# Patient Record
Sex: Male | Born: 1950 | Race: White | Hispanic: No | Marital: Married | Smoking: Never smoker
Health system: Southern US, Community
[De-identification: ages and names within clinical notes are randomized; demographics above are authoritative.]

## PROBLEM LIST (undated history)

## (undated) DIAGNOSIS — E669 Obesity, unspecified: Secondary | ICD-10-CM

## (undated) DIAGNOSIS — E7800 Pure hypercholesterolemia, unspecified: Secondary | ICD-10-CM

## (undated) DIAGNOSIS — I10 Essential (primary) hypertension: Secondary | ICD-10-CM

## (undated) DIAGNOSIS — K219 Gastro-esophageal reflux disease without esophagitis: Secondary | ICD-10-CM

## (undated) DIAGNOSIS — M5430 Sciatica, unspecified side: Secondary | ICD-10-CM

## (undated) DIAGNOSIS — M109 Gout, unspecified: Secondary | ICD-10-CM

## (undated) DIAGNOSIS — M542 Cervicalgia: Secondary | ICD-10-CM

## (undated) HISTORY — DX: Gout, unspecified: M10.9

## (undated) HISTORY — DX: Essential (primary) hypertension: I10

## (undated) HISTORY — PX: NECK SURGERY: SHX720

## (undated) HISTORY — DX: Obesity, unspecified: E66.9

## (undated) HISTORY — DX: Cervicalgia: M54.2

## (undated) HISTORY — DX: Gastro-esophageal reflux disease without esophagitis: K21.9

## (undated) HISTORY — DX: Sciatica, unspecified side: M54.30

## (undated) HISTORY — DX: Pure hypercholesterolemia, unspecified: E78.00

---

## 2010-06-22 ENCOUNTER — Ambulatory Visit (HOSPITAL_COMMUNITY)
Admission: RE | Admit: 2010-06-22 | Discharge: 2010-06-23 | Payer: Self-pay | Source: Home / Self Care | Attending: Neurosurgery | Admitting: Neurosurgery

## 2010-09-11 LAB — CBC
HCT: 42.7 % (ref 39.0–52.0)
Hemoglobin: 15 g/dL (ref 13.0–17.0)
MCH: 32.5 pg (ref 26.0–34.0)
MCHC: 35.1 g/dL (ref 30.0–36.0)
MCV: 92.4 fL (ref 78.0–100.0)
Platelets: 218 10*3/uL (ref 150–400)
RBC: 4.62 MIL/uL (ref 4.22–5.81)
RDW: 13.2 % (ref 11.5–15.5)

## 2010-09-11 LAB — BASIC METABOLIC PANEL
BUN: 13 mg/dL (ref 6–23)
GFR calc Af Amer: >60 mL/min (ref >60)
Potassium: 4.4 mEq/L (ref 3.5–5.1)

## 2010-09-11 LAB — SURGICAL PCR SCREEN: MRSA, PCR: NEGATIVE

## 2015-11-18 DIAGNOSIS — Z1389 Encounter for screening for other disorder: Secondary | ICD-10-CM | POA: Diagnosis not present

## 2015-11-18 DIAGNOSIS — R5381 Other malaise: Secondary | ICD-10-CM | POA: Diagnosis not present

## 2015-11-18 DIAGNOSIS — R51 Headache: Secondary | ICD-10-CM | POA: Diagnosis not present

## 2015-11-18 DIAGNOSIS — R5383 Other fatigue: Secondary | ICD-10-CM | POA: Diagnosis not present

## 2016-02-23 DIAGNOSIS — M5416 Radiculopathy, lumbar region: Secondary | ICD-10-CM | POA: Diagnosis not present

## 2016-02-23 DIAGNOSIS — M545 Low back pain: Secondary | ICD-10-CM | POA: Diagnosis not present

## 2016-02-23 DIAGNOSIS — M9903 Segmental and somatic dysfunction of lumbar region: Secondary | ICD-10-CM | POA: Diagnosis not present

## 2016-02-23 DIAGNOSIS — M6281 Muscle weakness (generalized): Secondary | ICD-10-CM | POA: Diagnosis not present

## 2016-02-23 DIAGNOSIS — M79605 Pain in left leg: Secondary | ICD-10-CM | POA: Diagnosis not present

## 2016-02-23 DIAGNOSIS — M9904 Segmental and somatic dysfunction of sacral region: Secondary | ICD-10-CM | POA: Diagnosis not present

## 2016-02-23 DIAGNOSIS — M5116 Intervertebral disc disorders with radiculopathy, lumbar region: Secondary | ICD-10-CM | POA: Diagnosis not present

## 2016-02-23 DIAGNOSIS — M9902 Segmental and somatic dysfunction of thoracic region: Secondary | ICD-10-CM | POA: Diagnosis not present

## 2016-02-23 DIAGNOSIS — M5430 Sciatica, unspecified side: Secondary | ICD-10-CM | POA: Diagnosis not present

## 2016-02-24 DIAGNOSIS — M9902 Segmental and somatic dysfunction of thoracic region: Secondary | ICD-10-CM | POA: Diagnosis not present

## 2016-02-24 DIAGNOSIS — M5116 Intervertebral disc disorders with radiculopathy, lumbar region: Secondary | ICD-10-CM | POA: Diagnosis not present

## 2016-02-24 DIAGNOSIS — M9903 Segmental and somatic dysfunction of lumbar region: Secondary | ICD-10-CM | POA: Diagnosis not present

## 2016-02-24 DIAGNOSIS — M9904 Segmental and somatic dysfunction of sacral region: Secondary | ICD-10-CM | POA: Diagnosis not present

## 2016-02-27 DIAGNOSIS — M6281 Muscle weakness (generalized): Secondary | ICD-10-CM | POA: Diagnosis not present

## 2016-02-27 DIAGNOSIS — M5416 Radiculopathy, lumbar region: Secondary | ICD-10-CM | POA: Diagnosis not present

## 2016-02-27 DIAGNOSIS — M79605 Pain in left leg: Secondary | ICD-10-CM | POA: Diagnosis not present

## 2016-02-27 DIAGNOSIS — M9903 Segmental and somatic dysfunction of lumbar region: Secondary | ICD-10-CM | POA: Diagnosis not present

## 2016-02-27 DIAGNOSIS — M545 Low back pain: Secondary | ICD-10-CM | POA: Diagnosis not present

## 2016-02-27 DIAGNOSIS — M5116 Intervertebral disc disorders with radiculopathy, lumbar region: Secondary | ICD-10-CM | POA: Diagnosis not present

## 2016-02-27 DIAGNOSIS — M9904 Segmental and somatic dysfunction of sacral region: Secondary | ICD-10-CM | POA: Diagnosis not present

## 2016-02-27 DIAGNOSIS — M9902 Segmental and somatic dysfunction of thoracic region: Secondary | ICD-10-CM | POA: Diagnosis not present

## 2016-02-29 DIAGNOSIS — M9902 Segmental and somatic dysfunction of thoracic region: Secondary | ICD-10-CM | POA: Diagnosis not present

## 2016-02-29 DIAGNOSIS — M9903 Segmental and somatic dysfunction of lumbar region: Secondary | ICD-10-CM | POA: Diagnosis not present

## 2016-02-29 DIAGNOSIS — M5116 Intervertebral disc disorders with radiculopathy, lumbar region: Secondary | ICD-10-CM | POA: Diagnosis not present

## 2016-02-29 DIAGNOSIS — M5416 Radiculopathy, lumbar region: Secondary | ICD-10-CM | POA: Diagnosis not present

## 2016-02-29 DIAGNOSIS — M79605 Pain in left leg: Secondary | ICD-10-CM | POA: Diagnosis not present

## 2016-02-29 DIAGNOSIS — M6281 Muscle weakness (generalized): Secondary | ICD-10-CM | POA: Diagnosis not present

## 2016-02-29 DIAGNOSIS — M9904 Segmental and somatic dysfunction of sacral region: Secondary | ICD-10-CM | POA: Diagnosis not present

## 2016-02-29 DIAGNOSIS — M545 Low back pain: Secondary | ICD-10-CM | POA: Diagnosis not present

## 2016-03-01 DIAGNOSIS — M9904 Segmental and somatic dysfunction of sacral region: Secondary | ICD-10-CM | POA: Diagnosis not present

## 2016-03-01 DIAGNOSIS — M5116 Intervertebral disc disorders with radiculopathy, lumbar region: Secondary | ICD-10-CM | POA: Diagnosis not present

## 2016-03-01 DIAGNOSIS — M9903 Segmental and somatic dysfunction of lumbar region: Secondary | ICD-10-CM | POA: Diagnosis not present

## 2016-03-01 DIAGNOSIS — M9902 Segmental and somatic dysfunction of thoracic region: Secondary | ICD-10-CM | POA: Diagnosis not present

## 2016-03-06 DIAGNOSIS — M545 Low back pain: Secondary | ICD-10-CM | POA: Diagnosis not present

## 2016-03-06 DIAGNOSIS — M9902 Segmental and somatic dysfunction of thoracic region: Secondary | ICD-10-CM | POA: Diagnosis not present

## 2016-03-06 DIAGNOSIS — M9903 Segmental and somatic dysfunction of lumbar region: Secondary | ICD-10-CM | POA: Diagnosis not present

## 2016-03-06 DIAGNOSIS — M9904 Segmental and somatic dysfunction of sacral region: Secondary | ICD-10-CM | POA: Diagnosis not present

## 2016-03-06 DIAGNOSIS — M6281 Muscle weakness (generalized): Secondary | ICD-10-CM | POA: Diagnosis not present

## 2016-03-06 DIAGNOSIS — M5116 Intervertebral disc disorders with radiculopathy, lumbar region: Secondary | ICD-10-CM | POA: Diagnosis not present

## 2016-03-06 DIAGNOSIS — M5416 Radiculopathy, lumbar region: Secondary | ICD-10-CM | POA: Diagnosis not present

## 2016-03-06 DIAGNOSIS — M79605 Pain in left leg: Secondary | ICD-10-CM | POA: Diagnosis not present

## 2016-03-07 DIAGNOSIS — M549 Dorsalgia, unspecified: Secondary | ICD-10-CM | POA: Diagnosis not present

## 2016-03-07 DIAGNOSIS — M4316 Spondylolisthesis, lumbar region: Secondary | ICD-10-CM | POA: Diagnosis not present

## 2016-03-07 DIAGNOSIS — M5136 Other intervertebral disc degeneration, lumbar region: Secondary | ICD-10-CM | POA: Diagnosis not present

## 2016-03-07 DIAGNOSIS — M5416 Radiculopathy, lumbar region: Secondary | ICD-10-CM | POA: Diagnosis not present

## 2016-03-15 DIAGNOSIS — N400 Enlarged prostate without lower urinary tract symptoms: Secondary | ICD-10-CM | POA: Diagnosis not present

## 2016-03-20 ENCOUNTER — Other Ambulatory Visit: Payer: Self-pay | Admitting: Orthopaedic Surgery

## 2016-03-20 DIAGNOSIS — M4310 Spondylolisthesis, site unspecified: Secondary | ICD-10-CM

## 2016-03-28 DIAGNOSIS — N401 Enlarged prostate with lower urinary tract symptoms: Secondary | ICD-10-CM | POA: Diagnosis not present

## 2016-03-28 DIAGNOSIS — N138 Other obstructive and reflux uropathy: Secondary | ICD-10-CM | POA: Diagnosis not present

## 2016-03-28 DIAGNOSIS — Z6829 Body mass index (BMI) 29.0-29.9, adult: Secondary | ICD-10-CM | POA: Diagnosis not present

## 2016-03-30 ENCOUNTER — Ambulatory Visit
Admission: RE | Admit: 2016-03-30 | Discharge: 2016-03-30 | Disposition: A | Discharge status: Home / Self Care | Payer: BLUE CROSS/BLUE SHIELD | Source: Ambulatory Visit | Attending: Orthopaedic Surgery | Admitting: Orthopaedic Surgery

## 2016-03-30 DIAGNOSIS — M5136 Other intervertebral disc degeneration, lumbar region: Secondary | ICD-10-CM | POA: Diagnosis not present

## 2016-03-30 DIAGNOSIS — M5126 Other intervertebral disc displacement, lumbar region: Secondary | ICD-10-CM | POA: Diagnosis not present

## 2016-03-30 DIAGNOSIS — M4316 Spondylolisthesis, lumbar region: Secondary | ICD-10-CM | POA: Diagnosis not present

## 2016-03-30 DIAGNOSIS — M4310 Spondylolisthesis, site unspecified: Secondary | ICD-10-CM

## 2016-03-30 DIAGNOSIS — M5416 Radiculopathy, lumbar region: Secondary | ICD-10-CM | POA: Diagnosis not present

## 2016-04-13 DIAGNOSIS — M5136 Other intervertebral disc degeneration, lumbar region: Secondary | ICD-10-CM | POA: Diagnosis not present

## 2016-04-13 DIAGNOSIS — M5416 Radiculopathy, lumbar region: Secondary | ICD-10-CM | POA: Diagnosis not present

## 2016-04-23 DIAGNOSIS — M5416 Radiculopathy, lumbar region: Secondary | ICD-10-CM | POA: Diagnosis not present

## 2016-05-07 DIAGNOSIS — M4316 Spondylolisthesis, lumbar region: Secondary | ICD-10-CM | POA: Diagnosis not present

## 2016-05-07 DIAGNOSIS — M5136 Other intervertebral disc degeneration, lumbar region: Secondary | ICD-10-CM | POA: Diagnosis not present

## 2016-05-07 DIAGNOSIS — M5416 Radiculopathy, lumbar region: Secondary | ICD-10-CM | POA: Diagnosis not present

## 2016-05-21 DIAGNOSIS — M5416 Radiculopathy, lumbar region: Secondary | ICD-10-CM | POA: Diagnosis not present

## 2016-07-09 DIAGNOSIS — M5416 Radiculopathy, lumbar region: Secondary | ICD-10-CM | POA: Diagnosis not present

## 2016-09-06 DIAGNOSIS — Z0000 Encounter for general adult medical examination without abnormal findings: Secondary | ICD-10-CM | POA: Diagnosis not present

## 2016-09-24 DIAGNOSIS — M5416 Radiculopathy, lumbar region: Secondary | ICD-10-CM | POA: Diagnosis not present

## 2016-09-24 DIAGNOSIS — M5136 Other intervertebral disc degeneration, lumbar region: Secondary | ICD-10-CM | POA: Diagnosis not present

## 2016-11-20 DIAGNOSIS — M109 Gout, unspecified: Secondary | ICD-10-CM | POA: Diagnosis not present

## 2016-11-20 DIAGNOSIS — E7800 Pure hypercholesterolemia, unspecified: Secondary | ICD-10-CM | POA: Diagnosis not present

## 2016-11-20 DIAGNOSIS — Z125 Encounter for screening for malignant neoplasm of prostate: Secondary | ICD-10-CM | POA: Diagnosis not present

## 2016-11-20 DIAGNOSIS — I10 Essential (primary) hypertension: Secondary | ICD-10-CM | POA: Diagnosis not present

## 2016-11-20 DIAGNOSIS — Z1389 Encounter for screening for other disorder: Secondary | ICD-10-CM | POA: Diagnosis not present

## 2016-11-20 DIAGNOSIS — R5383 Other fatigue: Secondary | ICD-10-CM | POA: Diagnosis not present

## 2017-04-09 DIAGNOSIS — Z0000 Encounter for general adult medical examination without abnormal findings: Secondary | ICD-10-CM | POA: Diagnosis not present

## 2017-04-09 DIAGNOSIS — Z6831 Body mass index (BMI) 31.0-31.9, adult: Secondary | ICD-10-CM | POA: Diagnosis not present

## 2017-04-10 ENCOUNTER — Encounter: Payer: Self-pay | Admitting: Neurology

## 2017-05-21 DIAGNOSIS — E049 Nontoxic goiter, unspecified: Secondary | ICD-10-CM | POA: Diagnosis not present

## 2017-07-15 ENCOUNTER — Ambulatory Visit: Payer: Self-pay | Admitting: Neurology

## 2017-08-02 ENCOUNTER — Encounter: Payer: Self-pay | Admitting: Neurology

## 2017-08-02 ENCOUNTER — Other Ambulatory Visit (INDEPENDENT_AMBULATORY_CARE_PROVIDER_SITE_OTHER): Payer: BLUE CROSS/BLUE SHIELD

## 2017-08-02 ENCOUNTER — Ambulatory Visit: Payer: BLUE CROSS/BLUE SHIELD | Admitting: Neurology

## 2017-08-02 VITALS — BP 120/84 | HR 50 | Ht 69.0 in | Wt 212.2 lb

## 2017-08-02 DIAGNOSIS — G5712 Meralgia paresthetica, left lower limb: Secondary | ICD-10-CM | POA: Insufficient documentation

## 2017-08-02 LAB — VITAMIN B12: Vitamin B-12: 1118 pg/mL — ABNORMAL HIGH (ref 211–911)

## 2017-08-02 LAB — TSH: TSH: 2.49 u[IU]/mL (ref 0.35–4.50)

## 2017-08-02 LAB — SEDIMENTATION RATE: Sed Rate: 8 mm/hr (ref 0–20)

## 2017-08-02 LAB — C-REACTIVE PROTEIN: CRP: 0.2 mg/dL — ABNORMAL LOW (ref 0.5–20.0)

## 2017-08-02 MED ORDER — GABAPENTIN 300 MG PO CAPS: ORAL_CAPSULE | ORAL | 5 refills | Status: AC

## 2017-08-02 NOTE — Progress Notes (Signed)
[facility] Clinic Note - Initial Visit   Date: 08/02/17  Randall RighterDonald E Denison MRN: [id number] DOB: January 05, 1951   Dear Dr. Jeanie Seweredding:  Thank you for your kind referral of Randall RighterDonald E Soohoo for consultation of left thigh numbness. Although his history is well known to you, please allow us to reiterate it for the purpose of our medical record. The patient was accompanied to the clinic by self.    History of Present Illness: Randall RighterDonald E Spychalski is a 67 y.o. right-handed Caucasian male with hyperlipidemia, GERD, gout, hypertension, and s/p cervical ACDF presenting for evaluation of left thigh pain.    Starting in September 2017, he was on a business trip and developed severe pain in which started in the low back and radiated down into his lateral thigh.  He was started on prednisone and physical therapy by his PCP.  He was referred to Spine specialist who administered injection at L5 which did not help.  He had another injection at L4 which alleviated his pain, but did not improve his numbness.  MRI lumbar spine showed mild impingement of the left L2 nerve root, but nothing affecting the L4 nerve.    He no longer has low back pain or weakness of the legs, such as climbing stairs.  Currently, he continues to have constant numbness over the anteriolateral thigh and gets tingling sensation with light touch or tactile stimuli, such as his keys in his pocket.  He has tried gabapentin and Lyrica for severe back pain, but has not taken this for his ongoing paresthesias.  He denies any weight gain or tight constrictive clothing.    Out-side paper records, electronic medical record, and images have been reviewed where available and summarized as:  MRI lumbar spine 03/30/2016:  1. At L2-3 there is a left lateral disc protrusion with mild mass effect on the left extraforaminal L2 nerve root. Labs 08/2016:  HbA1c 5.4  Past Medical History:  Diagnosis Date  . GERD (gastroesophageal reflux  disease)   . Gout   . Hypercholesteremia   . Hypertension   . Obesity     Past Surgical History:  Procedure Laterality Date  . NECK SURGERY     Medications:  Outpatient Encounter Medications as of 08/02/2017  Medication Sig  . allopurinol (ZYLOPRIM) 300 MG tablet   . amLODipine (NORVASC) 5 MG tablet   . atorvastatin (LIPITOR) 20 MG tablet   . Cholecalciferol (VITAMIN D3) 2000 units capsule Take by mouth.  Marland Kitchen. Co-Enzyme Q-10 30 MG CAPS Take by mouth.  . Cyanocobalamin (B-12 TR) 2000 MCG TBCR Take by mouth.  . lansoprazole (PREVACID) 15 MG capsule   . Multiple Vitamin (MULTIVITAMIN) capsule Take by mouth.  . olmesartan (BENICAR) 20 MG tablet Take by mouth.  . Probiotic Product ([employer] PROBIOTIC MAXIMUM STRENGTH) CAPS Take by mouth.   No facility-administered encounter medications on file as of 08/02/2017.      Allergies: No Known Allergies    Family History: Father had diabetes, diet-controlled and diet at the age of 65. Mother died of natural causes at 3192. Sister passed away from ovarian cancer. No family history of neurological conditions.  Social History: Social History   Tobacco Use  . Smoking status: Never Smoker  . Smokeless tobacco: Never Used  Substance Use Topics  . Alcohol use: Yes  . Drug use: No   Social History   Social History Narrative   Lives with wife in a 2 story home.  Has 2 children.  Works  in management.  Education: Scientist, water quality.    Review of Systems:  CONSTITUTIONAL: No fevers, chills, night sweats, or weight loss.   EYES: No visual changes or eye pain ENT: No hearing changes.  No history of nose bleeds.   RESPIRATORY: No cough, wheezing and shortness of breath.   CARDIOVASCULAR: Negative for chest pain, and palpitations.   GI: Negative for abdominal discomfort, blood in stools or black stools.  No recent change in bowel habits.   GU:  No history of incontinence.   MUSCLOSKELETAL: No history of joint pain or swelling.  No myalgias.   SKIN: Negative  for lesions, rash, and itching.   HEMATOLOGY/ONCOLOGY: Negative for prolonged bleeding, bruising easily, and swollen nodes.  No history of cancer.   ENDOCRINE: Negative for cold or heat intolerance, polydipsia or goiter.   PSYCH:  No depression or anxiety symptoms.   NEURO: As Above.   Vital Signs:  BP 120/84   Pulse (!) 50   Ht 5\' 9"  (1.753 m)   Wt 212 lb 4 oz (96.3 kg)   SpO2 98%   BMI 31.34 kg/m    General Medical Exam:   General:  Well appearing, comfortable.   Eyes/ENT: see cranial nerve examination.   Neck: No masses appreciated.  Full range of motion without tenderness.  No carotid bruits. Respiratory:  Clear to auscultation, good air entry bilaterally.   Cardiac:  Regular rate and rhythm, no murmur.   Extremities:  No deformities, edema, or skin discoloration.  Skin:  No rashes or lesions.  Neurological Exam: MENTAL STATUS including orientation to time, place, person, recent and remote memory, attention span and concentration, language, and fund of knowledge is normal.  Speech is not dysarthric.  CRANIAL NERVES: II:  No visual field defects.  Unremarkable fundi.   III-IV-VI: Pupils equal round and reactive to light.  Normal conjugate, extra-ocular eye movements in all directions of gaze.  No nystagmus.  No ptosis.   V:  Normal facial sensation.    VII:  Normal facial symmetry and movements.  No pathologic facial reflexes.  VIII:  Normal hearing and vestibular function.   IX-X:  Normal palatal movement.   XI:  Normal shoulder shrug and head rotation.   XII:  Normal tongue strength and range of motion, no deviation or fasciculation.  MOTOR:  No atrophy, fasciculations or abnormal movements.  No pronator drift.  Tone is normal.   There are no palpable masses over the LFCN region on the left.  Right Upper Extremity:    Left Upper Extremity:    Deltoid  5/5   Deltoid  5/5   Biceps  5/5   Biceps  5/5   Triceps  5/5   Triceps  5/5   Wrist extensors  5/5   Wrist extensors   5/5   Wrist flexors  5/5   Wrist flexors  5/5   Finger extensors  5/5   Finger extensors  5/5   Finger flexors  5/5   Finger flexors  5/5   Dorsal interossei  5/5   Dorsal interossei  5/5   Abductor pollicis  5/5   Abductor pollicis  5/5   Tone (Ashworth scale)  0  Tone (Ashworth scale)  0   Right Lower Extremity:    Left Lower Extremity:    Hip flexors  5/5   Hip flexors  5/5   Hip extensors  5/5   Hip extensors  5/5   Knee flexors  5/5   Knee flexors  5/5   Knee extensors  5/5   Knee extensors  5/5   Dorsiflexors  5/5   Dorsiflexors  5/5   Plantarflexors  5/5   Plantarflexors  5/5   Toe extensors  5/5   Toe extensors  5/5   Toe flexors  5/5   Toe flexors  5/5   Tone (Ashworth scale)  0  Tone (Ashworth scale)  0   MSRs:  Right                                                                 Left brachioradialis 2+  brachioradialis 2+  biceps 2+  biceps 2+  triceps 2+  triceps 2+  patellar 2+  patellar 2+  ankle jerk 2+  ankle jerk 2+  Hoffman no  Hoffman no  plantar response down  plantar response down   SENSORY:  Reduced temperature and light touch over the left anterolateral thigh, hyperesthesia with pin prick over the same region.  Otherwise, normal and symmetric perception of light touch, pinprick, vibration, and proprioception.  Romberg's sign absent.   COORDINATION/GAIT: Normal finger-to- nose-finger and heel-to-shin.  Intact rapid alternating movements bilaterally.  Able to rise from a chair without using arms.  Gait narrow based and stable. Tandem and stressed gait intact.    IMPRESSION: Mr. Karl is a 67 year-old man referred for evaluation of left anterolateral thigh numbness and pain.  His examination is consistent with diminished sensation in all modalities over the anterolateral thigh on the left side. 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 Management is  conservative with symptom management and avoidance of repetitive trauma to this region.  I have discussed the diagnosis, pathophysiology, management plan, and risks and benefits of medications, and prognosis.  I do not see that his lumbar injections have contributed to his paresthesias, as they do not confirm to either L4 or L5 dermatome.     PLAN/RECOMMENDATIONS:  1. Start neurontin 300mg  at bedtimex 1 week, then increase to 1 tablet twice daily 2. Start OTC lidocaine ointment to left thigh 3. Avoid tight fitting clothing or belts  4. Weight loss encouraged 5. NCS/EMG of the left > right leg - it was explained that LFCN is technically challenging to study and often absent in normal individuals, but the study would exclude lumbosacral radiculopathy  Return to clinic in 70-months  Thank you for allowing me to participate in patient's care.  If I can answer any additional questions, I would be pleased to do so.    Sincerely,    Donika K. Allena Katz, DO

## 2017-08-02 NOTE — Patient Instructions (Signed)
1.  Start gabapentin 300mg  at bedtime x 1 week, then increase to 1 tablet twice daily 2.  You can also try over the counter lidocaine ointments (Aspercream, Salonpas, etc).  3.  NCS/EMG of the left > right leg 4.  Check labs   Return to clinic in 4 months

## 2017-08-05 ENCOUNTER — Telehealth: Payer: Self-pay | Admitting: *Deleted

## 2017-08-05 NOTE — Telephone Encounter (Signed)
Left message giving patient results.  

## 2017-08-05 NOTE — Telephone Encounter (Signed)
-----   Message from [place] Chardonika K Patel, DO sent at 08/02/2017  4:13 PM EST ----- Please notify patient lab are within normal limits.  Thank you.

## 2017-08-08 ENCOUNTER — Ambulatory Visit (INDEPENDENT_AMBULATORY_CARE_PROVIDER_SITE_OTHER): Payer: BLUE CROSS/BLUE SHIELD | Admitting: Neurology

## 2017-08-08 DIAGNOSIS — G5712 Meralgia paresthetica, left lower limb: Secondary | ICD-10-CM

## 2017-08-08 NOTE — Progress Notes (Signed)
Follow-up Visit   Date: 08/08/17    Randell E Keach MRN: [id number] DOB: 12/10/1950   Interim History: Valentin E Copeman is a 67 y.o. right-handed Caucasian male with hyperlipidemia, GERD, gout, hypertension, and s/p cervical ACDF returning to the clinic for electrodiagnostic testing for left thigh numbness.  The patient was accompanied to the clinic by self.  Electrodiagnostic testing shows no evidence of a lumbosacral radiculopathy or sensorimotor polyneuropathy affecting the left lower extremity. Bilateral femoral cutaneous sensory responses are absent, and we discussed this to be a technical finding especially given his unaffected side is also absent.  Labs thus far have been normal (ESR, vitamin B 12, TSH, and CRP).  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 There is no evidence of a L3-L4 radiculopathy on imaging or by electrodiagnostic testing. Patient is currently on gabapentin 300 mg at bedtime and has not noticed marked improvement in his numbness. I encouraged him to continue this for another month and if he does not appreciate any improvement in his pain, he may stop this as medications do not help with sensation of numbness. If his symptoms get worse when he develops acute pelvic pain, we can consider imaging of the upper thigh to look for structural lesion as he does not have typical risk factors for meralgia paresthetica. All questions were answered.  Return to clinic in 6 months.  Greater than 80% of this 15 minute visit was spent in counseling, explanation of diagnosis, planning of further management, and coordination of care (excluding procedure time).  Thank you for allowing me to participate in patient's care.  If I can answer any additional questions, I would be pleased to do so.    Sincerely,    Donika K. Patel, DO   

## 2017-08-08 NOTE — Procedures (Signed)
[facility]  [address], [address]  [place], [place] [address] Tel: [phone] Fax:  [phone] Test Date:  08/08/2017  Patient: Randall Williamson DOB: 1950/10/13 Physician: Nita Sickleonika Johaan Ryser, DO  Sex: Male Height: 5\' 9"  Ref Phys: Nita Sickleonika Quanda Pavlicek, DO  ID#: [id number] Temp: 34.2C Technician:    Patient Complaints: This is a 67 year-old man referred for evaluation of left lateral thigh numbness.  NCV & EMG Findings: Extensive electrodiagnostic testing of the left lower extremity and additional studies of the right shows:  1. Left sural and superficial peroneal sensory responses are within normal limits. Bilateral lateral femoral cutaneous sensory responses are absent; these findings are most likely technical in nature. 2. Left tibial and peroneal motor responses are within normal limits. 3. Left tibial H reflex study is within normal limits. 4. There is no evidence of active or chronic motor axon loss changes affecting any of the tested muscles. Motor unit configuration and recruitment pattern is within normal limits.  Impression: This is a normal study of the left lower extremity. In particular, there is no evidence of a lumbosacral radiculopathy or sensorimotor polyneuropathy.  Absent bilateral femoral cutaneous sensory responses are most likely technical in nature; correlate clinically.   ___________________________ Nita Sickleonika Nishan Ovens, DO    Nerve Conduction Studies Anti Sensory Summary Table   Stim Site NR Peak (ms) Norm Peak (ms) P-T Amp (V) Norm P-T Amp  Left Lat Femoral Cutan Anti Sensory (Lateral Thigh)  ASIS NR  <3.1  >10  Right Lat Femoral Cutan Anti Sensory (Lateral Thigh)  ASIS NR  <3.1  >10  Left Sup Peroneal Anti Sensory (Ant Lat Mall)  12 cm    3.8 <4.6 9.0 >3  Left Sural Anti Sensory (Lat Mall)  Calf    3.5 <4.6 9.9 >3   Motor Summary Table   Stim Site NR Onset (ms) Norm Onset (ms) O-P Amp (mV) Norm O-P Amp Site1 Site2 Delta-0 (ms) Dist (cm) Vel (m/s)  Norm Vel (m/s)  Left Peroneal Motor (Ext Dig Brev)  Ankle    3.6 <6.0 7.9 >2.5 B Fib Ankle 7.9 35.0 44 >40  B Fib    11.5  7.0  Poplt B Fib 1.7 9.0 53 >40  Poplt    13.2  6.7         Left Tibial Motor (Abd Hall Brev)  Ankle    5.5 <6.0 8.8 >4 Knee Ankle 9.0 37.0 41 >40  Knee    14.5  7.0          H Reflex Studies   NR H-Lat (ms) Lat Norm (ms) L-R H-Lat (ms)  Left Tibial (Gastroc)     33.20 <35    EMG   Side Muscle Ins Act Fibs Psw Fasc Number Recrt Dur Dur. Amp Amp. Poly Poly. Comment  Left AntTibialis Nml Nml Nml Nml Nml Nml Nml Nml Nml Nml Nml Nml N/A  Left Gastroc Nml Nml Nml Nml Nml Nml Nml Nml Nml Nml Nml Nml N/A  Left RectFemoris Nml Nml Nml Nml Nml Nml Nml Nml Nml Nml Nml Nml N/A  Left GluteusMed Nml Nml Nml Nml Nml Nml Nml Nml Nml Nml Nml Nml N/A  Left Iliacus Nml Nml Nml Nml Nml Nml Nml Nml Nml Nml Nml Nml N/A      Waveforms:

## 2017-09-09 ENCOUNTER — Ambulatory Visit: Payer: BLUE CROSS/BLUE SHIELD | Admitting: Neurology

## 2017-11-20 DIAGNOSIS — E782 Mixed hyperlipidemia: Secondary | ICD-10-CM | POA: Diagnosis not present

## 2017-11-20 DIAGNOSIS — Z125 Encounter for screening for malignant neoplasm of prostate: Secondary | ICD-10-CM | POA: Diagnosis not present

## 2017-11-20 DIAGNOSIS — R5383 Other fatigue: Secondary | ICD-10-CM | POA: Diagnosis not present

## 2017-11-20 DIAGNOSIS — I10 Essential (primary) hypertension: Secondary | ICD-10-CM | POA: Diagnosis not present

## 2017-11-20 DIAGNOSIS — Z79899 Other long term (current) drug therapy: Secondary | ICD-10-CM | POA: Diagnosis not present

## 2017-12-13 ENCOUNTER — Ambulatory Visit: Payer: BLUE CROSS/BLUE SHIELD | Admitting: Neurology

## 2018-05-28 DIAGNOSIS — Z23 Encounter for immunization: Secondary | ICD-10-CM | POA: Diagnosis not present

## 2018-05-28 DIAGNOSIS — I10 Essential (primary) hypertension: Secondary | ICD-10-CM | POA: Diagnosis not present

## 2018-05-28 DIAGNOSIS — Z6831 Body mass index (BMI) 31.0-31.9, adult: Secondary | ICD-10-CM | POA: Diagnosis not present

## 2018-05-28 DIAGNOSIS — Z1331 Encounter for screening for depression: Secondary | ICD-10-CM | POA: Diagnosis not present

## 2018-05-28 DIAGNOSIS — Z1339 Encounter for screening examination for other mental health and behavioral disorders: Secondary | ICD-10-CM | POA: Diagnosis not present

## 2018-06-05 DIAGNOSIS — Z23 Encounter for immunization: Secondary | ICD-10-CM | POA: Diagnosis not present

## 2018-09-18 IMAGING — MR MR LUMBAR SPINE W/O CM
4 of 5 series · 23 of 48 positions shown · non-contrast
Comparison: None.

CLINICAL DATA: Left low back pain, left groin pain

EXAM:
MRI LUMBAR SPINE WITHOUT CONTRAST
TECHNIQUE: Multiplanar, multisequence MR imaging of the lumbar spine was
performed. No intravenous contrast was administered.

[Series 5: T2 · sagittal · 4.0mm · 0.67mm/px · 7 of 15 slices shown (1 of 2)]
[im 1/15]
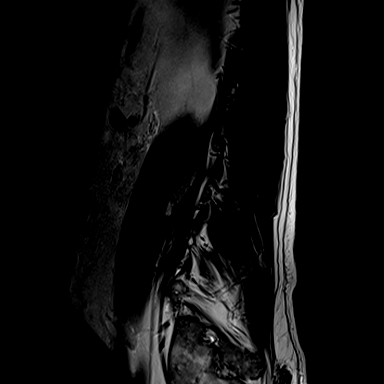
[im 3/15]
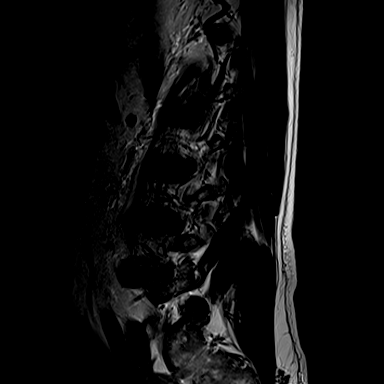
[im 5/15]
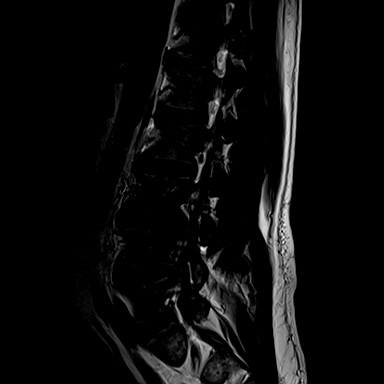
[im 8/15]
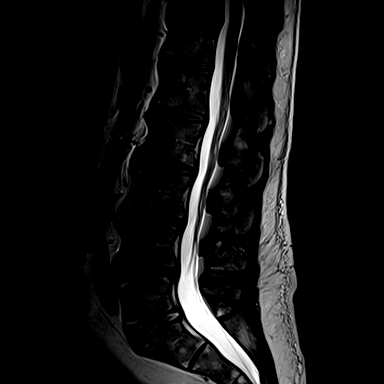
[im 10/15]
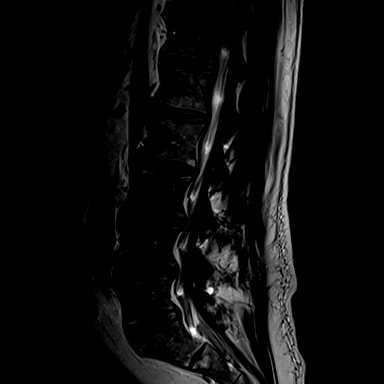
[im 12/15]
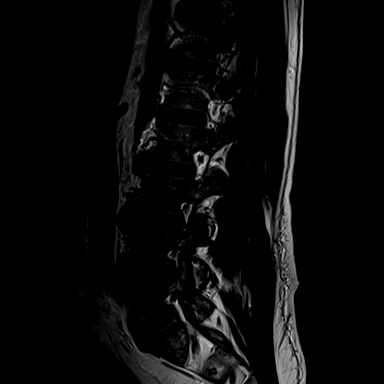
[im 15/15]
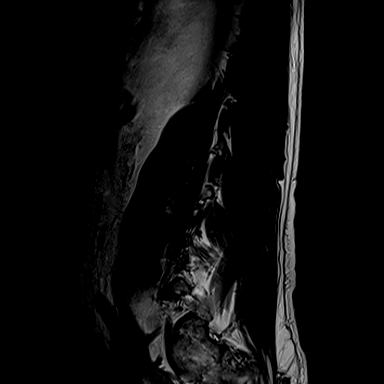

[Series 6: T1 · sagittal · 4.0mm · 0.81mm/px · 5 of 15 slices shown (1 of 2)]
[im 1/15]
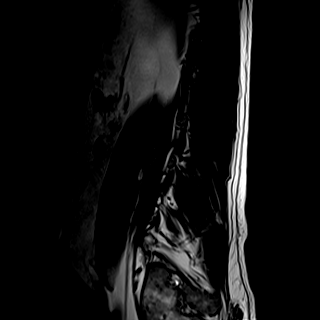
[im 3/15]
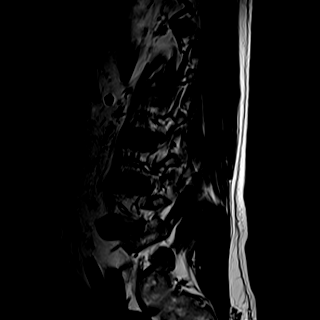
[im 5/15]
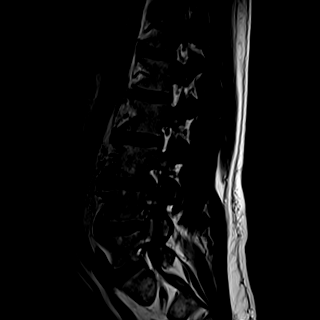
[im 8/15]
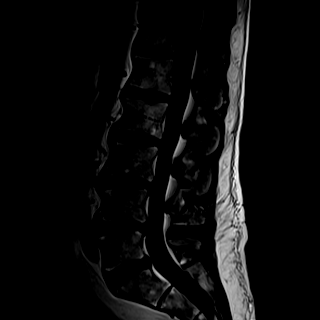
[im 12/15]
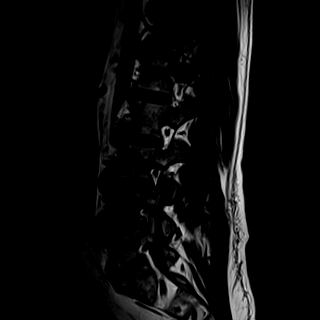

[Series 10: T2 · axial · 4.0mm · 0.28mm/px · z∈[-141,+34]mm · 8 of 33 slices shown (2 of 2)]
[im 1/33]
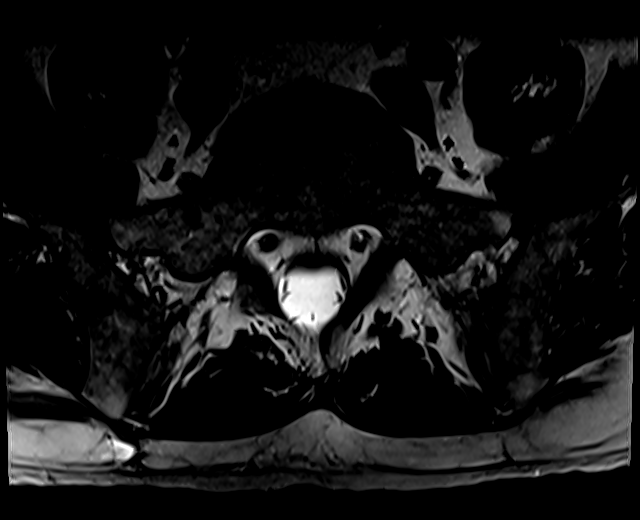
[im 5/33]
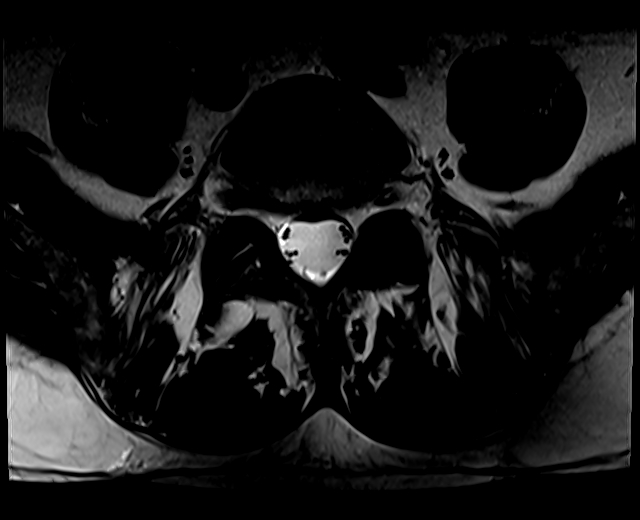
[im 10/33]
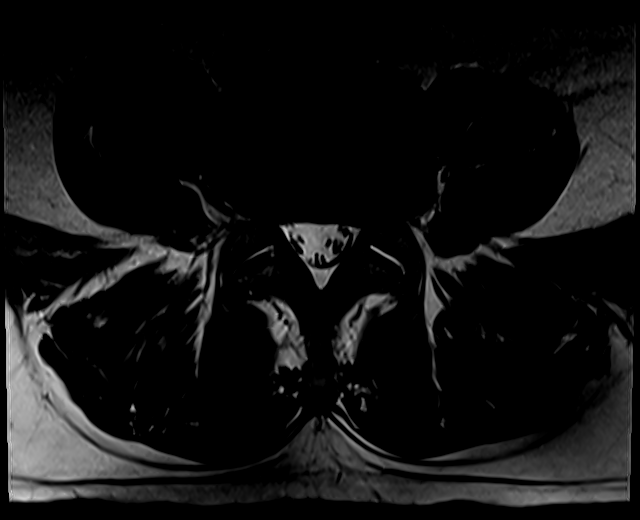
[im 15/33]
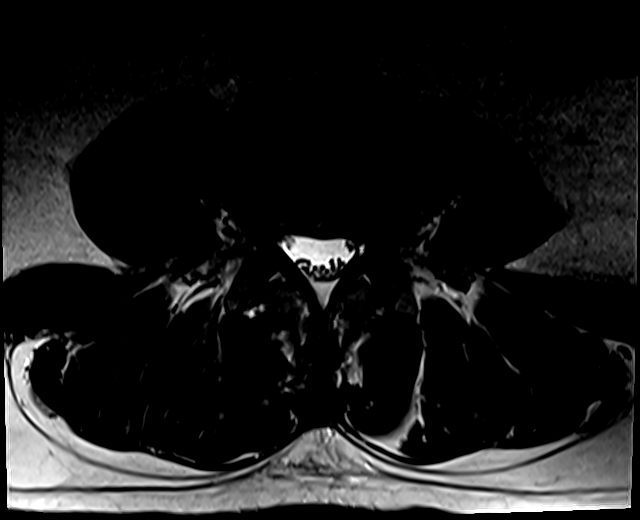
[im 18/33]
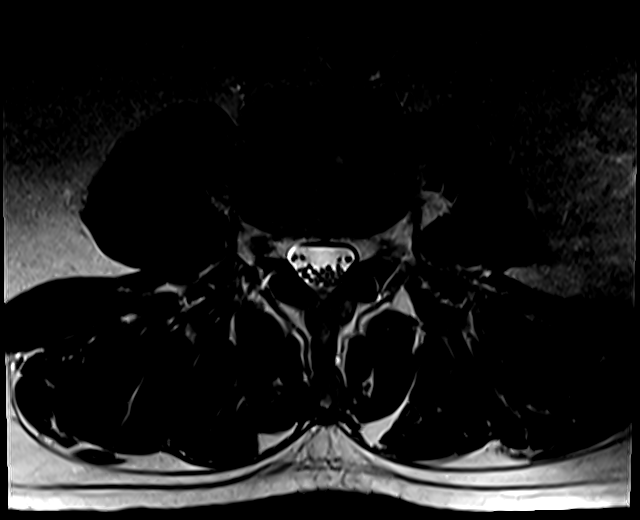
[im 23/33]
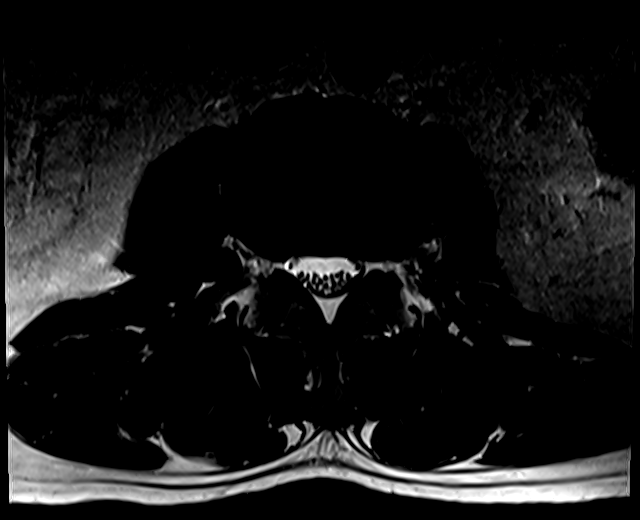
[im 28/33]
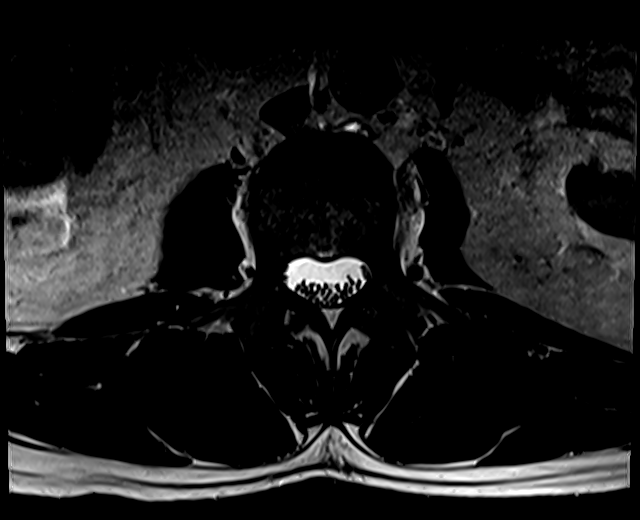
[im 33/33]
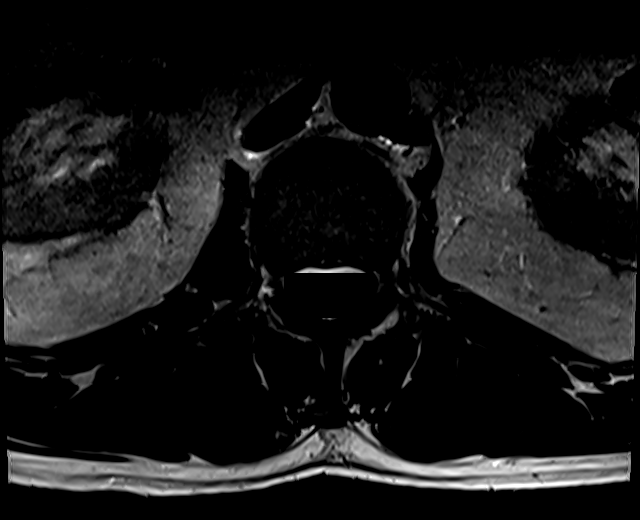

[Series 100: T1 · axial · 4.0mm · 0.28mm/px · z∈[-121,+10]mm · 3 of 33 slices shown (2 of 2)]
[im 5/33]
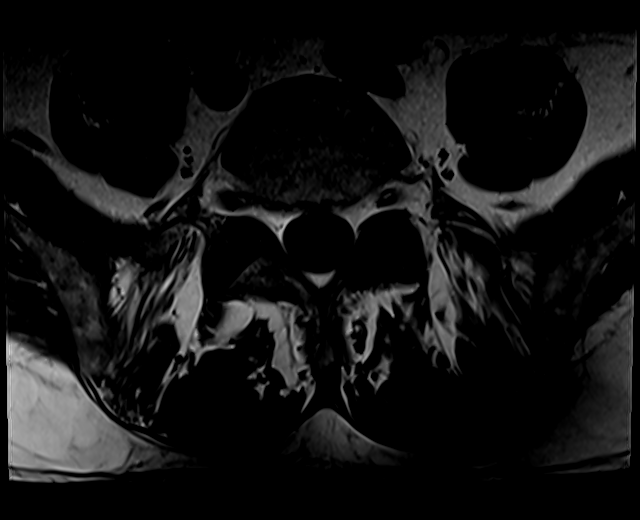
[im 18/33]
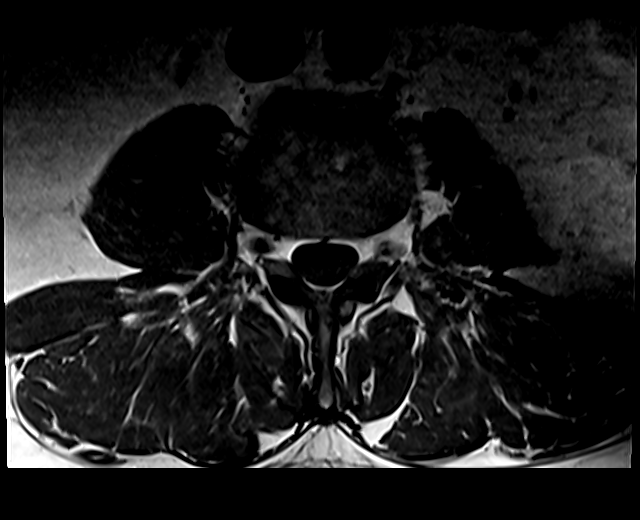
[im 28/33]
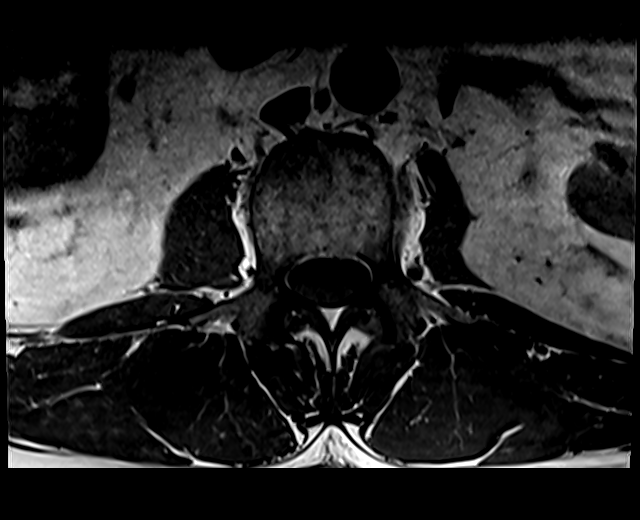

[23 of 48 positions shown; findings below may reference images not displayed]

FINDINGS: Segmentation:  Standard.

Alignment:  Physiologic.

Vertebrae:  No fracture, evidence of discitis, or bone lesion.

Conus medullaris: Extends to the T12-L1 level and appears normal.

Paraspinal and other soft tissues: No paraspinal abnormality.

Disc levels:

Disc spaces: Disc desiccation at L3-4 and L4-5.

T11-12: Mild broad-based disc bulge. No foraminal or central canal
stenosis.

T12-L1: No significant disc bulge. No evidence of neural foraminal
stenosis. No central canal stenosis.

L1-L2: Mild broad-based disc bulge. No evidence of neural foraminal
stenosis. No central canal stenosis.

L2-L3: Left lateral disc protrusion with mild mass effect on the
left extraforaminal L2 nerve root. No evidence of neural foraminal
stenosis. No central canal stenosis.

L3-L4: Mild broad-based disc bulge. No evidence of neural foraminal
stenosis. No central canal stenosis.

L4-L5: Mild broad-based disc bulge. No evidence of neural foraminal
stenosis. No central canal stenosis.

L5-S1: No significant disc bulge. No evidence of neural foraminal
stenosis. No central canal stenosis.
IMPRESSION: 1. At L2-3 there is a left lateral disc protrusion with mild mass
effect on the left extraforaminal L2 nerve root.

## 2018-10-21 DIAGNOSIS — Z0000 Encounter for general adult medical examination without abnormal findings: Secondary | ICD-10-CM | POA: Diagnosis not present

## 2018-10-21 DIAGNOSIS — Z13220 Encounter for screening for lipoid disorders: Secondary | ICD-10-CM | POA: Diagnosis not present

## 2018-10-21 DIAGNOSIS — Z125 Encounter for screening for malignant neoplasm of prostate: Secondary | ICD-10-CM | POA: Diagnosis not present

## 2018-10-21 DIAGNOSIS — Z9181 History of falling: Secondary | ICD-10-CM | POA: Diagnosis not present

## 2018-10-21 DIAGNOSIS — Z6830 Body mass index (BMI) 30.0-30.9, adult: Secondary | ICD-10-CM | POA: Diagnosis not present

## 2018-12-29 DIAGNOSIS — K5730 Diverticulosis of large intestine without perforation or abscess without bleeding: Secondary | ICD-10-CM | POA: Diagnosis not present

## 2019-01-16 DIAGNOSIS — I10 Essential (primary) hypertension: Secondary | ICD-10-CM | POA: Diagnosis not present

## 2019-01-16 DIAGNOSIS — M109 Gout, unspecified: Secondary | ICD-10-CM | POA: Diagnosis not present

## 2019-01-16 DIAGNOSIS — D126 Benign neoplasm of colon, unspecified: Secondary | ICD-10-CM | POA: Diagnosis not present

## 2019-01-16 DIAGNOSIS — Z79899 Other long term (current) drug therapy: Secondary | ICD-10-CM | POA: Diagnosis not present

## 2019-01-16 DIAGNOSIS — K621 Rectal polyp: Secondary | ICD-10-CM | POA: Diagnosis not present

## 2019-01-16 DIAGNOSIS — Z86010 Personal history of colonic polyps: Secondary | ICD-10-CM | POA: Diagnosis not present

## 2019-01-16 DIAGNOSIS — K648 Other hemorrhoids: Secondary | ICD-10-CM | POA: Diagnosis not present

## 2019-01-16 DIAGNOSIS — K5730 Diverticulosis of large intestine without perforation or abscess without bleeding: Secondary | ICD-10-CM | POA: Diagnosis not present

## 2019-06-12 DIAGNOSIS — Z23 Encounter for immunization: Secondary | ICD-10-CM | POA: Diagnosis not present

## 2019-06-30 DIAGNOSIS — Z6831 Body mass index (BMI) 31.0-31.9, adult: Secondary | ICD-10-CM | POA: Diagnosis not present

## 2019-06-30 DIAGNOSIS — I10 Essential (primary) hypertension: Secondary | ICD-10-CM | POA: Diagnosis not present

## 2019-06-30 DIAGNOSIS — E7800 Pure hypercholesterolemia, unspecified: Secondary | ICD-10-CM | POA: Diagnosis not present

## 2019-06-30 DIAGNOSIS — Z1331 Encounter for screening for depression: Secondary | ICD-10-CM | POA: Diagnosis not present

## 2019-08-14 ENCOUNTER — Ambulatory Visit: Payer: BLUE CROSS/BLUE SHIELD

## 2019-09-23 DIAGNOSIS — D3131 Benign neoplasm of right choroid: Secondary | ICD-10-CM | POA: Diagnosis not present

## 2020-01-18 DIAGNOSIS — Z6830 Body mass index (BMI) 30.0-30.9, adult: Secondary | ICD-10-CM | POA: Diagnosis not present

## 2020-01-18 DIAGNOSIS — M109 Gout, unspecified: Secondary | ICD-10-CM | POA: Diagnosis not present

## 2020-01-18 DIAGNOSIS — Z125 Encounter for screening for malignant neoplasm of prostate: Secondary | ICD-10-CM | POA: Diagnosis not present

## 2020-01-18 DIAGNOSIS — E7800 Pure hypercholesterolemia, unspecified: Secondary | ICD-10-CM | POA: Diagnosis not present

## 2020-01-18 DIAGNOSIS — I10 Essential (primary) hypertension: Secondary | ICD-10-CM | POA: Diagnosis not present

## 2020-05-16 DIAGNOSIS — Z23 Encounter for immunization: Secondary | ICD-10-CM | POA: Diagnosis not present

## 2020-05-16 DIAGNOSIS — Z0000 Encounter for general adult medical examination without abnormal findings: Secondary | ICD-10-CM | POA: Diagnosis not present

## 2020-05-16 DIAGNOSIS — Z6830 Body mass index (BMI) 30.0-30.9, adult: Secondary | ICD-10-CM | POA: Diagnosis not present

## 2020-07-25 DIAGNOSIS — R972 Elevated prostate specific antigen [PSA]: Secondary | ICD-10-CM | POA: Diagnosis not present

## 2020-12-01 DIAGNOSIS — D3131 Benign neoplasm of right choroid: Secondary | ICD-10-CM | POA: Diagnosis not present

## 2020-12-12 DIAGNOSIS — Z6830 Body mass index (BMI) 30.0-30.9, adult: Secondary | ICD-10-CM | POA: Diagnosis not present

## 2020-12-12 DIAGNOSIS — I10 Essential (primary) hypertension: Secondary | ICD-10-CM | POA: Diagnosis not present

## 2020-12-12 DIAGNOSIS — L259 Unspecified contact dermatitis, unspecified cause: Secondary | ICD-10-CM | POA: Diagnosis not present

## 2021-02-23 DIAGNOSIS — R972 Elevated prostate specific antigen [PSA]: Secondary | ICD-10-CM | POA: Diagnosis not present

## 2024-10-06 ENCOUNTER — Ambulatory Visit (HOSPITAL_BASED_OUTPATIENT_CLINIC_OR_DEPARTMENT_OTHER): Admitting: Family Medicine
